# Patient Record
Sex: Male | Born: 1955 | Race: White | Hispanic: No | Marital: Married | Smoking: Never smoker
Health system: Southern US, Community
[De-identification: ages and names within clinical notes are randomized; demographics above are authoritative.]

## PROBLEM LIST (undated history)

## (undated) DIAGNOSIS — K222 Esophageal obstruction: Secondary | ICD-10-CM

## (undated) DIAGNOSIS — E785 Hyperlipidemia, unspecified: Secondary | ICD-10-CM

## (undated) DIAGNOSIS — H409 Unspecified glaucoma: Secondary | ICD-10-CM

## (undated) DIAGNOSIS — K219 Gastro-esophageal reflux disease without esophagitis: Secondary | ICD-10-CM

## (undated) HISTORY — DX: Gastro-esophageal reflux disease without esophagitis: K21.9

## (undated) HISTORY — DX: Unspecified glaucoma: H40.9

## (undated) HISTORY — DX: Hyperlipidemia, unspecified: E78.5

## (undated) HISTORY — PX: CHOLECYSTECTOMY: SHX55

## (undated) HISTORY — DX: Esophageal obstruction: K22.2

---

## 2002-08-07 ENCOUNTER — Ambulatory Visit (HOSPITAL_COMMUNITY): Admission: RE | Admit: 2002-08-07 | Discharge: 2002-08-07 | Payer: Self-pay | Admitting: Internal Medicine

## 2005-01-29 ENCOUNTER — Ambulatory Visit: Payer: Self-pay | Admitting: Internal Medicine

## 2005-01-29 ENCOUNTER — Ambulatory Visit (HOSPITAL_COMMUNITY): Admission: RE | Admit: 2005-01-29 | Discharge: 2005-01-29 | Payer: Self-pay | Admitting: Internal Medicine

## 2006-12-13 ENCOUNTER — Ambulatory Visit: Payer: Self-pay | Admitting: Internal Medicine

## 2006-12-23 ENCOUNTER — Ambulatory Visit (HOSPITAL_COMMUNITY): Admission: RE | Admit: 2006-12-23 | Discharge: 2006-12-23 | Payer: Self-pay | Admitting: Internal Medicine

## 2006-12-23 ENCOUNTER — Encounter: Payer: Self-pay | Admitting: Internal Medicine

## 2006-12-23 ENCOUNTER — Ambulatory Visit: Payer: Self-pay | Admitting: Internal Medicine

## 2006-12-23 HISTORY — PX: COLONOSCOPY: SHX174

## 2006-12-23 HISTORY — PX: ESOPHAGOGASTRODUODENOSCOPY: SHX1529

## 2009-08-19 ENCOUNTER — Emergency Department (HOSPITAL_COMMUNITY): Admission: EM | Admit: 2009-08-19 | Discharge: 2009-08-19 | Payer: Self-pay | Admitting: Emergency Medicine

## 2010-03-07 ENCOUNTER — Encounter (INDEPENDENT_AMBULATORY_CARE_PROVIDER_SITE_OTHER): Payer: Self-pay

## 2010-07-29 NOTE — Letter (Signed)
Summary: Recall, Screening Colonoscopy Only  [facility]  [address]   [place], [place] [address]   Phone: [phone]  Fax: [phone]    March 07, 2010  HAGOP MCCOLLAM [address] [place], [place]  [address] 06-13-1956   Dear Mr. Hellmer,   Our records indicate it is time to schedule your colonoscopy.    Please call our office at [phone] and ask for the nurse.   Thank you,  Hendricks Limes, LPN Cloria Spring, LPN  [facility] Ph: [phone]   Fax: [phone]

## 2010-11-11 NOTE — H&P (Signed)
NAMEANTOINETTE, Damon Wilson              ACCOUNT NO.:  [id number]   MEDICAL RECORD NO.:  [id number]          PATIENT TYPE:  AMB   LOCATION:                                FACILITY:  [facility]   PHYSICIAN:  R. Roetta Sessions, M.D. DATE OF BIRTH:  [birth year]   DATE OF ADMISSION:  12/13/2006  DATE OF DISCHARGE:  LH                              HISTORY & PHYSICAL   CHIEF COMPLAINT:  Recurrent dysphagia/screening colonoscopy.   HISTORY OF PRESENT ILLNESS:  Mr. Carcione is a 55 year old Caucasian male  with history of esophageal dysphagia due to Schatzki's ring.  He was  last dilated on January 29, 2005, by Dr. Jena Gauss.  He was dilated with a 60-  [place] Maloney dilator which only partially ruptured the ring,  subsequently three quadrant bites of the ring with cold biopsy forceps  were taken to additionally disrupt.  He also has a small hiatal hernia.  He tells me about 7 months ago he developed the sensation that his food  was backing up.  He points to his mid and upper esophagus.  He  complains of chest pain, occasional heartburn, indigestion.  He is on  AcipHex 20 mg daily which does seem to help with the majority of his  symptoms.  He has problems with solid foods only, denies any problems  with liquids, denies any nausea, vomiting or regurgitation.  He is in  need of screening colonoscopy.  Denies any diarrhea or constipation.  Denies rectal bleeding or melena.   PAST MEDICAL AND SURGICAL HISTORY:  History of multiple esophageal  dilatations due to recurrent dysphagia due to Schatzki's ring.  He has a  history of small hiatal hernia, last EGD as described in 2006 in HPI.  Hypercholesterolemia.  Glaucoma.  Status post laparoscopic  cholecystectomy.   CURRENT MEDICATIONS:  AcipHex 20 mg daily, simvastatin 10 mg daily  living, Lumigan drops once daily.   ALLERGIES:  NO KNOWN DRUG ALLERGIES.   FAMILY HISTORY:  Positive for father deceased with pancreatic cancer at  age 44.   SOCIAL HISTORY:  Mr.  Daniel is married.  He has three grown healthy  children.  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  He denies tobacco, alcohol, or  drug use.   REVIEW OF SYSTEMS:  CONSTITUTIONAL:  Weight is stable.  Denies any  anorexia, fever or chills.  Otherwise negative review of systems.   PHYSICAL EXAMINATION:  VITAL SIGNS:  Weight 260.5 pounds, height 73  inches, temperature 98.3, blood pressure 118/80, pulse 60.  GENERAL:  Mr. Oriordan is an obese Caucasian male who is alert, pleasant,  cooperative, no acute distress.  HEENT:  Sclerae clear, nonicteric.  Conjunctivae pink.  Oropharynx pink  and moist without any lesions.  NECK:  Supple without mass or thyromegaly.  CHEST:  Heart regular rate and rhythm.  Normal S1-S2.  No murmurs,  clicks, rubs, or gallops.  LUNGS:  Clear to auscultation bilaterally.  ABDOMEN:  Protuberant with positive bowel sounds x4.  No bruits  auscultated.  Soft, nontender and nondistended without palpable mass or  hepatosplenomegaly, no apparent tenderness or guarding.  EXTREMITIES:  Without clubbing or edema bilaterally.  SKIN:  Pink, warm, and dry without any rash or jaundice.   IMPRESSION:  Mr. Thelin is a 55 year old Caucasian male with recurrent  dysphagia I suspect due to recurrence of Schatzki's ring.  He has  chronic gastroesophageal reflux disease with symptoms well-controlled on  PPI.  He is due for screening colonoscopy.   PLAN:  1. EGD with possible ED and screening colonoscopy with Dr. Jena Gauss in      the near future.  Discussed the procedure including risks and      benefits which include but are not limited bleeding, infection,      perforation, drug reaction.  He agrees with plan and consent will      be obtained.  2. Continue AcipHex 20 mg daily.      Lorenza Burton, N.P.      [name], M.D.  Electronically Signed    KJ/MEDQ  D:  12/13/2006  T:  12/13/2006  Job:  [id number]   cc:   Selinda Flavin  Fax: [phone]

## 2010-11-11 NOTE — Op Note (Signed)
NAME:  Damon Wilson, Damon Wilson              ACCOUNT NO.:  [id number]   MEDICAL RECORD NO.:  [id number]          PATIENT TYPE:  AMB   LOCATION:  DAY                           FACILITY:  [facility]   PHYSICIAN:  R. Roetta Sessions, M.D. DATE OF BIRTH:  Mar 22, 1956   DATE OF PROCEDURE:  12/23/2006  DATE OF DISCHARGE:                               OPERATIVE REPORT   PROCEDURES:  EGD (esophagogastroduodenoscopy) with Damon Wilson dilation  followed by biopsy followed by colonoscopy and snare polypectomy.   INDICATIONS FOR PROCEDURE:  The patient is a pleasant 55 year old  gentleman with history of esophageal dysphagia secondary to Schatzki's  ring who presents with recurrent esophageal dysphagia.  His ring was  last dilated on January 29, 2005.  He was dilated up to 60-French [facility]  dilator and had biopsy disruption the ring additionally.   He is also here for screening colonoscopy.  There is no family history  of colorectal neoplasia.  He has no lower GI tract symptoms.  He has  never had his lower GI tract evaluated.  EGD with possible esophageal  dilation and colonoscopy are now being done.  This approach was  discussed  with the patient at length.  Potential risks, benefits, and  alternatives have been reviewed.  Please see documentation in the  medical record.   MONITORING:  O2 saturation, blood pressure, pulse, and respirations were  monitored throughout the entirety of the above procedures.   CONSCIOUS SEDATION:  Conscious sedation for the above procedures was  Versed 6 mg IV and Demerol 75 mg IV in divided doses.   INSTRUMENT:  Pentax video chip system.   Cetacaine spray for topical pharyngeal anesthesia.   FINDINGS:  ESOPHAGOGASTRODUODENOSCOPY:  Examination of tubular esophagus  revealed a prominent Schatzki ring.  There was a minimally ribbed  appearance to the mid esophageal mucosa.  However, this was subtle, and  the esophageal lumen midway was pretty much large bore.  The esophageal  mucosa  otherwise appeared normal.  EG junction was easily traversed with  the scope.   STOMACH:  Gastric cavity was empty and inflated well with air.  Thorough  examination of the gastric mucosa including retroflexion showed the  proximal stomach and esophagogastric junction demonstrated only a small  hiatal hernia.  There was a 5-mm nodule straddling the EG junction just  on the gastric side of the ring.  Otherwise, the gastric mucosa appeared  normal.  Pylorus was patent and easily traversed.  Examination of the  bulb and second portion revealed no abnormalities aside from prominent  Brunner glands in the bulb.   THERAPEUTIC/DIAGNOSTIC MANEUVERS PERFORMED:  A 58-French Maloney dilator  was passed to full insertion.  A look back revealed a nice disruption of  the ring.  There was no mucosal tear upstream of the ring.  Subsequently, then nodule at the GE junction was biopsied for histologic  study.  I elected to go ahead and biopsy the mid esophagus just to rule  out the remote possibility of eosinophilic esophagitis.  The patient  tolerated this procedure well and was prepared for colonoscopy.   Digital rectal  exam revealed no abnormalities.   ENDOSCOPIC FINDINGS:  The prep was good.  Colonic mucosa was surveyed  from the rectosigmoid junction through the left transverse, right colon,  and to the area of the appendiceal orifice, ileocecal valve and cecum.  These structures were well seen and photographed for the record.  Terminal ileum was intubated to 10 cm.  From this level, the scope was  slowly and cautiously withdrawn.  All previously mentioned mucosal  surfaces were again seen.  The patient had extensive left-sided  diverticula.  The patient had a 1-cm sessile polyp on a fold of the mid  descending colon.  Please see photos.  This was engaged with the snare  and totally resected with 2 passes of polyp fragments recovered through  the scope.  The remainder of the colonic mucosa  appeared normal.  The  terminal ileum mucosa appeared normal.  Scope was pulled down into the  rectum.  A thorough examination of the rectal mucosa including  retroflexed view of the anal verge revealed no abnormalities.  The  patient tolerated the procedure well and was reactive after endoscopy.   IMPRESSION:  1. EGD (esophagogastroduodenoscopy) revealed prominent Schatzki ring,      status post dilation and disruption as described above.  Nodule at      the GE (gastroesophageal) junction was biopsied.  Mid esophagus was      biopsied as well.  Small hiatal hernia.  Otherwise, normal stomach,      D1 and D2.  2. Colonoscopy findings:  Normal rectum.  Left-sided diverticula.      Polyp in the ascending colon removed with hot snare cautery.      Remainder of the colonic mucosa and terminal ileum mucosa appeared      normal.   RECOMMENDATIONS:  1. Continue Aciphex 20 mg orally daily.  2. Literature on hiatal hernia, gastroesophageal reflux disease and      polyps as well as diverticulosis provided to [name].  3. No aspirin or arthritis medications for 10 days.  4. Followup on path.   DISCUSSION:  I doubt [name] has eosinophilic esophagitis.  The  esophageal lumen throughout his esophagus down to the level of the  Schatzki ring was widely patent and his esophagus tolerated passage of a  58-French Maloney dilator without any disruption of the esophageal  mucosa aside from the distal Schatzki ring.  (I feel the ring is the  prominent problem in [name] case.)  Further recommendations to  follow.      [name], M.D.  Electronically Signed     RMR/MEDQ  D:  12/23/2006  T:  12/23/2006  Job:  [id number]   cc:   Selinda Flavin  Fax: [phone]

## 2010-11-14 NOTE — Op Note (Signed)
[name], [name]              ACCOUNT NO.:  [id number]   MEDICAL RECORD NO.:  [id number]          PATIENT TYPE:  AMB   LOCATION:  DAY                           FACILITY:  [facility]   PHYSICIAN:  R. Roetta Sessions, M.D. DATE OF BIRTH:  Dec 03, 1955   DATE OF PROCEDURE:  01/29/2005  DATE OF DISCHARGE:                                 OPERATIVE REPORT   PROCEDURE:  Esophagogastroduodenoscopy with Elease Hashimoto dilation.   INDICATIONS FOR PROCEDURE:  The patient is a 55 year old gentleman with a  known Schatzki's ring who has had recurrent esophageal dysphagia since  January this year back in February of 2004. He underwent an EGD for similar  complaints. He was found to have a Schatzki's ring and small hiatal hernia  and was dilated with a 58-French Maloney dilator. He enjoyed improvement in  dysphagia symptoms until recently. His reflux symptoms were also well  controlled on Aciphex until he was switched to Protonix. This did not work  nearly as well, and now he is back on Aciphex with excellent control of his  symptoms. EGD is now being done. Potential for esophageal dilation has been  fully explained. Potential risks, benefits, and alternatives have been  reviewed and questions answered. He is agreeable. Please see documentation  in the medical record.   PROCEDURE NOTE:  O2 saturation, blood pressure, pulse, and respirations were  monitored throughout the entire procedure. Conscious sedation with Versed 3  mg IV and Demerol 75 mg IV in divided doses. Cetacaine spray for topical  oropharyngeal anesthesia.   FINDINGS:  Examination of the tubular esophagus again revealed a Schatzki's  ring. The esophageal mucosa otherwise appeared normal. EG junction easily  traversed.   Stomach:  Gastric cavity was empty and insufflated well with air. Thorough  examination of gastric mucosa including retroflexed view of the proximal  stomach and esophagogastric junction demonstrated a small hiatal hernia.  Otherwise gastric mucosa appeared normal. Pylorus patent and easily  traversed. Examination of bulb and second portion revealed no abnormalities.   THERAPEUTIC/DIAGNOSTIC MANEUVERS:  A 60-French Maloney dilator was passed to  full insertion easily. A look back revealed the ring had only been partially  ruptured. Subsequently, three quadrant bites of the ring with cold biopsy  forceps were taken to additionally disrupt. This was done without difficulty  and without apparent complication. The patient tolerated the procedure well  and was reactive to endoscopy.   IMPRESSION:  1.  Schatzki's ring, otherwise normal esophagus status post dilation and      disruption as described above. Otherwise normal esophagus.  2.  Small hiatal hernia. Otherwise normal stomach. Normal D1 and D2.   RECOMMENDATIONS:  1.  Continue acid suppression with Aciphex 20 mg orally daily.  2.  Mr. Bunton is to call me if he has any future difficulties with      dysphagia.       RMR/MEDQ  D:  01/29/2005  T:  01/29/2005  Job:  [id number]   cc:   Marcelino Duster  [address] [place]  [place] [address]  Fax: [phone]

## 2010-11-14 NOTE — Op Note (Signed)
NAME:  DEMAREON, Damon Wilson                        ACCOUNT NO.:  [id number]   MEDICAL RECORD NO.:  [id number]                   PATIENT TYPE:  AMB   LOCATION:  DAY                                  FACILITY:  [facility]   PHYSICIAN:  R. Roetta Sessions, M.D.              DATE OF BIRTH:  February 20, 1956   DATE OF PROCEDURE:  08/07/2002  DATE OF DISCHARGE:                                 OPERATIVE REPORT   PROCEDURE:  Esophagogastroduodenoscopy with Elease Hashimoto dilation.   INDICATION FOR PROCEDURE:  The patient is a 55 year old gentleman with  esophageal dysphagia.  He underwent Maloney dilation with a 46 French  Maloney dilator back in January 2001 at [facility] for dysphagia and  was found to have a Schatzki's ring.  He has done well for the past one  year.  He has had intermittent esophageal dysphagia and had what sounds like  a transient meat impaction at a steak house last weekend, which necessitated  an emergency department visit.  He vomited in the emergency department and  disimpacted himself.  EGD is now being done to further evaluate his symptoms  and perform dilation.  This approach has been discussed with the patient,  and the potential risks, benefits, and alternatives have been reviewed,  questions answered, and he is agreeable.  Please note, his reflux symptoms  have been well-controlled with Aciphex, Prevacid, and most recently Protonix  40 mg orally daily.  ASA-2.   DESCRIPTION OF PROCEDURE:  O2 saturation, blood pressure, pulse, and  respirations were monitored throughout the entire procedure.   Conscious sedation:  Versed 3 mg IV, Demerol 75 mg IV, Cetacaine spray for  topical oropharyngeal anesthesia.   Instrument:  Olympus video chip gastroscope.   Findings:  Examination of the tubular esophagus revealed a noncritical-  appearing Schatzki's ring.  The esophageal mucosa otherwise appeared normal.  The EG junction was easily traversed.   Stomach:  The gastric cavity was  entered and insufflated well with air.  A  thorough examination of the gastric mucosa, including a retroflexed view of  the proximal stomach and esophagogastric junction, demonstrated only a small  hiatal hernia.  The pylorus was patent and easily traversed.  D1, D2 were  well-seen and appeared normal.   Therapeutic/diagnostic maneuvers performed:  A 56 French Maloney dilator was  passed fully with ease.  Subsequently a 30 French Maloney dilator was passed  fully with ease.  A look back revealed a slight amount of blood at the EG  junction.  The ring appeared to have been ruptured without apparent  complication.  The patient tolerated the procedure well, was reactive in  endoscopy.   IMPRESSION:  1. Noncritical-appearing Schatzki's ring, status post dilation up to a 58     French size, otherwise normal-appearing esophagus.  2. Small hiatal hernia, otherwise normal stomach, D1, D2.    RECOMMENDATIONS:  1. Continue Protonix 40 mg orally daily.  2. Follow up with Dr. Orvan Falconer as needed.                                               [name], M.D.    RMR/MEDQ  D:  08/07/2002  T:  08/07/2002  Job:  [id number]   cc:   Marcelino Duster  [address] [place]  [place] [address]  Fax: [phone]

## 2012-05-19 ENCOUNTER — Other Ambulatory Visit: Payer: Self-pay | Admitting: Family Medicine

## 2012-05-19 DIAGNOSIS — R05 Cough: Secondary | ICD-10-CM

## 2012-05-19 DIAGNOSIS — J90 Pleural effusion, not elsewhere classified: Secondary | ICD-10-CM

## 2012-05-20 ENCOUNTER — Ambulatory Visit
Admission: RE | Admit: 2012-05-20 | Discharge: 2012-05-20 | Disposition: A | Discharge status: Home / Self Care | Payer: Managed Care, Other (non HMO) | Source: Ambulatory Visit | Attending: Family Medicine | Admitting: Family Medicine

## 2012-05-20 DIAGNOSIS — J90 Pleural effusion, not elsewhere classified: Secondary | ICD-10-CM

## 2012-05-20 DIAGNOSIS — R05 Cough: Secondary | ICD-10-CM

## 2012-05-20 MED ORDER — IOHEXOL 300 MG/ML  SOLN
75.0000 mL | Freq: Once | INTRAMUSCULAR | Status: AC | PRN
  Administered 2012-05-20: 75 mL via INTRAVENOUS

## 2012-12-14 LAB — WBC
HCT: 38 %
MCV: 72 fL — AB (ref 80–97)
WBC: 7

## 2012-12-27 ENCOUNTER — Telehealth: Payer: Self-pay

## 2012-12-27 NOTE — Telephone Encounter (Signed)
LMOM for pt to call and schedule OV appt.

## 2012-12-27 NOTE — Telephone Encounter (Signed)
Pt was referred by Dr. Dimas Aguas for colonoscopy. Notes say he has anemia ( Hemogobin 11.3 ) and that he might need EGD.

## 2012-12-29 NOTE — Telephone Encounter (Signed)
Pt is scheduled for OV on 01/18/2013 at 8:30 AM with Tana Coast, PA.

## 2013-01-16 ENCOUNTER — Encounter: Payer: Self-pay | Admitting: Internal Medicine

## 2013-01-18 ENCOUNTER — Encounter: Payer: Self-pay | Admitting: Gastroenterology

## 2013-01-18 ENCOUNTER — Ambulatory Visit (INDEPENDENT_AMBULATORY_CARE_PROVIDER_SITE_OTHER): Payer: Managed Care, Other (non HMO) | Admitting: Gastroenterology

## 2013-01-18 VITALS — BP 110/80 | HR 51 | Temp 98.0°F | Ht 73.0 in | Wt 245.6 lb

## 2013-01-18 DIAGNOSIS — R1314 Dysphagia, pharyngoesophageal phase: Secondary | ICD-10-CM

## 2013-01-18 DIAGNOSIS — R1310 Dysphagia, unspecified: Secondary | ICD-10-CM | POA: Insufficient documentation

## 2013-01-18 DIAGNOSIS — K219 Gastro-esophageal reflux disease without esophagitis: Secondary | ICD-10-CM | POA: Insufficient documentation

## 2013-01-18 DIAGNOSIS — D509 Iron deficiency anemia, unspecified: Secondary | ICD-10-CM | POA: Insufficient documentation

## 2013-01-18 LAB — CBC WITH DIFFERENTIAL/PLATELET
Basophils Relative: 1 % (ref 0–1)
Eosinophils Absolute: 0.1 10*3/uL (ref 0.0–0.7)
Eosinophils Relative: 2 % (ref 0–5)
MCH: 21.4 pg — ABNORMAL LOW (ref 26.0–34.0)
MCV: 68 fL — ABNORMAL LOW (ref 78.0–100.0)
Monocytes Relative: 14 % — ABNORMAL HIGH (ref 3–12)
Platelets: 273 10*3/uL (ref 150–400)
RDW: 17.5 % — ABNORMAL HIGH (ref 11.5–15.5)

## 2013-01-18 MED ORDER — PEG 3350-KCL-NA BICARB-NACL 420 G PO SOLR: 4000.0000 mL | ORAL | Status: DC

## 2013-01-18 NOTE — Progress Notes (Signed)
Cc PCP 

## 2013-01-18 NOTE — Assessment & Plan Note (Signed)
Recent trial off of the omeprazole for one week. Recurrent heartburn indicating need for ongoing treatment. Resume omeprazole 20 mg daily.

## 2013-01-18 NOTE — Assessment & Plan Note (Addendum)
Recurrent esophageal dysphagia status post multiple esophageal dilations in the past for Schatzki ring. Previous esophageal biopsies negative for eosinophilic esophagitis. Last upper endoscopy in 2008. Plan for EGD with dilation in the near future with Dr. Jena Gauss.  I have discussed the risks, alternatives, benefits with regards to but not limited to the risk of reaction to medication, bleeding, infection, perforation and the patient is agreeable to proceed. Written consent to be obtained.  Continue omeprazole daily.

## 2013-01-18 NOTE — Patient Instructions (Signed)
1. We have scheduled you for an upper endoscopy and colonoscopy with Dr. Jena Gauss. Please see separate instructions. 2. Please have your blood work done at your earliest convenience.

## 2013-01-18 NOTE — Progress Notes (Signed)
Primary Care Physician:  HOWARD, KEVIN, MD  Primary Gastroenterologist:  Michael Rourk, MD   Chief Complaint  Patient presents with  . Colonoscopy  . EGD    HPI:  Damon Wilson is a 57 y.o. male here for further evaluation of microcytic anemia. Recent routine labs showed hemoglobin of 11.3, MCV 72. C/O solid food esophageal dysphagia. Burning in upper chest while eating recently lasted for 10-15 minutes. Feels dizzy.   No heartburn. Lost 44 pounds intentionally. Now off cholesterol medication. Stopped omeprazole for one week to see if heartburn present but had to resume the medication. No constipation, diarrhea. No melena, brbpr. No abdominal pain.  Current Outpatient Prescriptions  Medication Sig Dispense Refill  . omeprazole (PRILOSEC) 20 MG capsule Take 20 mg by mouth daily.       . Travoprost, BAK Free, (TRAVATAN) 0.004 % SOLN ophthalmic solution Place 1 drop into both eyes at bedtime.       No current facility-administered medications for this visit.    Allergies as of 01/18/2013  . (No Known Allergies)    Past Medical History  Diagnosis Date  . Glaucoma   . Hyperlipidemia   . GERD (gastroesophageal reflux disease)   . Schatzki's ring     multiple dilations    Past Surgical History  Procedure Laterality Date  . Esophagogastroduodenoscopy  12/23/2006    RMR:prominent Schatzki ring s/p dilation and disruption as described above.Nodule at the GE juction/Mid esophagus/Small hiatal hernia. Esophageal nodule benign. Esophageal biopsies unremarkable.  . Colonoscopy  12/23/2006    RMR: Normal rectum.  Left-sided diverticula. Polyp in the descending colon(tubular adenoma). Normal terminal ileum.  . Cholecystectomy      Family History  Problem Relation Age of Onset  . Pancreatic cancer Father     Age 68  . Colon cancer Neg Hx     History   Social History  . Marital Status: Married    Spouse Name: N/A    Number of Children: 3  . Years of Education: N/A    Occupational History  . Pastor    Social History Main Topics  . Smoking status: Never Smoker   . Smokeless tobacco: Not on file  . Alcohol Use: No  . Drug Use: No  . Sexually Active: Not on file   Other Topics Concern  . Not on file   Social History Narrative  . No narrative on file      ROS:  General: Negative for anorexia, weight loss, fever, chills, fatigue, weakness. Mild dizziness. Eyes: Negative for vision changes.  ENT: Negative for hoarseness, difficulty swallowing , nasal congestion. CV: Negative for chest pain, angina, palpitations, dyspnea on exertion, peripheral edema.  Respiratory: Negative for dyspnea at rest, dyspnea on exertion, cough, sputum, wheezing.  GI: See history of present illness. GU:  Negative for dysuria, hematuria, urinary incontinence, urinary frequency, nocturnal urination.  MS: Negative for joint pain, low back pain.  Derm: Negative for rash or itching.  Neuro: Negative for weakness, abnormal sensation, seizure, frequent headaches, memory loss, confusion.  Psych: Negative for anxiety, depression, suicidal ideation, hallucinations.  Endo: Negative for unusual weight change.  Heme: Negative for bruising or bleeding. Allergy: Negative for rash or hives.    Physical Examination:  BP 97/65  Pulse 51  Temp(Src) 98 F (36.7 C) (Oral)  Ht 6' 1" (1.854 m)  Wt 245 lb 9.6 oz (111.403 kg)  BMI 32.41 kg/m2   General: Well-nourished, well-developed in no acute distress.  Head: Normocephalic, atraumatic.     Eyes: Conjunctiva pink, no icterus. Mouth: Oropharyngeal mucosa moist and pink , no lesions erythema or exudate. Neck: Supple without thyromegaly, masses, or lymphadenopathy.  Lungs: Clear to auscultation bilaterally.  Heart: Regular rate and rhythm, no murmurs rubs or gallops.  Abdomen: Bowel sounds are normal, nontender, nondistended, no hepatosplenomegaly or masses, no abdominal bruits or    hernia , no rebound or guarding.   Rectal:  Deferred Extremities: No lower extremity edema. No clubbing or deformities.  Neuro: Alert and oriented x 4 , grossly normal neurologically.  Skin: Warm and dry, no rash or jaundice.   Psych: Alert and cooperative, normal mood and affect.  Labs: Labs from 12/13/2012. Hemoglobin 11.3, hematocrit 30.3, MCV 72, white blood cell count 7000, platelets 274,000.  Imaging Studies: No results found.    

## 2013-01-18 NOTE — Assessment & Plan Note (Signed)
Mild microcytic anemia. Hemoccult status unknown. Last colonoscopy in 2008. Tubular adenoma removed at that time. Colonoscopy in the near future.  I have discussed the risks, alternatives, benefits with regards to but not limited to the risk of reaction to medication, bleeding, infection, perforation and the patient is agreeable to proceed. Written consent to be obtained.

## 2013-01-20 NOTE — Progress Notes (Signed)
Quick Note:  Please let patient know his anemia is stable. Procedures as planned. ______

## 2013-01-25 ENCOUNTER — Encounter (HOSPITAL_COMMUNITY): Payer: Self-pay | Admitting: Pharmacy Technician

## 2013-02-02 ENCOUNTER — Encounter (HOSPITAL_COMMUNITY)
Admission: RE | Disposition: A | Discharge status: Home / Self Care | Payer: Self-pay | Source: Ambulatory Visit | Attending: Internal Medicine

## 2013-02-02 ENCOUNTER — Encounter (HOSPITAL_COMMUNITY): Payer: Self-pay | Admitting: *Deleted

## 2013-02-02 ENCOUNTER — Ambulatory Visit (HOSPITAL_COMMUNITY)
Admission: RE | Admit: 2013-02-02 | Discharge: 2013-02-02 | Disposition: A | Discharge status: Home / Self Care | Payer: Managed Care, Other (non HMO) | Source: Ambulatory Visit | Attending: Internal Medicine | Admitting: Internal Medicine

## 2013-02-02 DIAGNOSIS — Z860100 Personal history of colon polyps, unspecified: Secondary | ICD-10-CM | POA: Insufficient documentation

## 2013-02-02 DIAGNOSIS — R1310 Dysphagia, unspecified: Secondary | ICD-10-CM

## 2013-02-02 DIAGNOSIS — D509 Iron deficiency anemia, unspecified: Secondary | ICD-10-CM

## 2013-02-02 DIAGNOSIS — K219 Gastro-esophageal reflux disease without esophagitis: Secondary | ICD-10-CM

## 2013-02-02 DIAGNOSIS — K222 Esophageal obstruction: Secondary | ICD-10-CM

## 2013-02-02 DIAGNOSIS — K5730 Diverticulosis of large intestine without perforation or abscess without bleeding: Secondary | ICD-10-CM | POA: Insufficient documentation

## 2013-02-02 DIAGNOSIS — K449 Diaphragmatic hernia without obstruction or gangrene: Secondary | ICD-10-CM | POA: Insufficient documentation

## 2013-02-02 DIAGNOSIS — K633 Ulcer of intestine: Secondary | ICD-10-CM | POA: Insufficient documentation

## 2013-02-02 HISTORY — PX: ESOPHAGEAL DILATION: SHX303

## 2013-02-02 HISTORY — PX: COLONOSCOPY WITH ESOPHAGOGASTRODUODENOSCOPY (EGD): SHX5779

## 2013-02-02 SURGERY — COLONOSCOPY WITH ESOPHAGOGASTRODUODENOSCOPY (EGD)
Anesthesia: Moderate Sedation

## 2013-02-02 MED ORDER — SODIUM CHLORIDE 0.9 % IV SOLN
INTRAVENOUS | Status: DC
  Administered 2013-02-02: 09:00:00 via INTRAVENOUS

## 2013-02-02 MED ORDER — MEPERIDINE HCL 100 MG/ML IJ SOLN
INTRAMUSCULAR | Status: DC | PRN
  Administered 2013-02-02: 50 mg via INTRAVENOUS
  Administered 2013-02-02: 25 mg via INTRAVENOUS

## 2013-02-02 MED ORDER — ONDANSETRON HCL 4 MG/2ML IJ SOLN
INTRAMUSCULAR | Status: DC | PRN
  Administered 2013-02-02: 4 mg via INTRAVENOUS

## 2013-02-02 MED ORDER — SIMETHICONE 40 MG/0.6ML PO SUSP
ORAL | Status: DC | PRN
  Administered 2013-02-02: 10:00:00

## 2013-02-02 MED ORDER — MIDAZOLAM HCL 5 MG/5ML IJ SOLN
INTRAMUSCULAR | Status: DC | PRN
  Administered 2013-02-02: 2 mg via INTRAVENOUS
  Administered 2013-02-02: 1 mg via INTRAVENOUS

## 2013-02-02 MED ORDER — MIDAZOLAM HCL 5 MG/5ML IJ SOLN
INTRAMUSCULAR | Status: AC
  Filled 2013-02-02: qty 10

## 2013-02-02 MED ORDER — BUTAMBEN-TETRACAINE-BENZOCAINE 2-2-14 % EX AERO
INHALATION_SPRAY | CUTANEOUS | Status: DC | PRN
  Administered 2013-02-02: 2 via TOPICAL

## 2013-02-02 MED ORDER — MEPERIDINE HCL 100 MG/ML IJ SOLN
INTRAMUSCULAR | Status: AC
  Filled 2013-02-02: qty 1

## 2013-02-02 MED ORDER — ONDANSETRON HCL 4 MG/2ML IJ SOLN
INTRAMUSCULAR | Status: AC
  Filled 2013-02-02: qty 2

## 2013-02-02 NOTE — Op Note (Signed)
[facility] [address] [place], [address]   COLONOSCOPY PROCEDURE REPORT  PATIENT: Damon Wilson, Damon Wilson  MR#:         [id number] BIRTHDATE: [birth year] , 56  yrs. old GENDER: Male ENDOSCOPIST: R.  Roetta Sessions, MD FACP FACG REFERRED BY:  Selinda Flavin, M.D. PROCEDURE DATE:  02/02/2013 PROCEDURE:     Ileocolonoscopy with biopsy  INDICATIONS: iron deficiency anemia; history of colonic adenoma  INFORMED CONSENT:  The risks, benefits, alternatives and imponderables including but not limited to bleeding, perforation as well as the possibility of a missed lesion have been reviewed.  The potential for biopsy, lesion removal, etc. have also been discussed.  Questions have been answered.  All parties agreeable. Please see the history and physical in the medical record for more information.  MEDICATIONS: Versed 4 mg IV and Demerol 75 mg IV in divided doses. Zofran 4 g IV  DESCRIPTION OF PROCEDURE:  After a digital rectal exam was performed, the EC-[id number] ([id number])  colonoscope was advanced from the anus through the rectum and colon to the area of the cecum, ileocecal valve and appendiceal orifice.  The cecum was deeply intubated.  These structures were well-seen and photographed for the record.  From the level of the cecum and ileocecal valve, the scope was slowly and cautiously withdrawn.  The mucosal surfaces were carefully surveyed utilizing scope tip deflection to facilitate fold flattening as needed.  The scope was pulled down into the rectum where a thorough examination including retroflexion was performed.    FINDINGS:  Adequate preparation. Normal rectum. Pancolonic diverticulosis. Colonic mucosa otherwise appeared normal. The patient had multiple  ulcers in the distal 10 cm of terminal ileal mucosa.  THERAPEUTIC / DIAGNOSTIC MANEUVERS PERFORMED:  The abnormal terminal ileal mucosa was biopsied  COMPLICATIONS: None  CECAL WITHDRAWAL TIME:  13  minutes  IMPRESSION:  Colonic diverticulosis. Ileal ulcers-status post biopsy  RECOMMENDATIONS: Followup on pathology. Avoid all nonsteroidals for now. See EGD report.   _______________________________ eSigned:  R. Roetta Sessions, MD FACP [facility] 02/02/2013 10:38 AM   CC:    PATIENT NAME:  Damon Wilson, Damon Wilson MR#: [id number]

## 2013-02-02 NOTE — Op Note (Signed)
[facility] [address] [place], [address]   ENDOSCOPY PROCEDURE REPORT  PATIENT: Damon Wilson, Damon Wilson  MR#: [id number] BIRTHDATE: Dec 30, 1955 , 56  yrs. old GENDER: Male ENDOSCOPIST: R.  Roetta Sessions, MD FACP FACG REFERRED BY:  Selinda Flavin, M.D. PROCEDURE DATE:  02/02/2013 PROCEDURE:     EGD with Elease Hashimoto dilation  INDICATIONS:     Recurrent esophageal dysphagia; history of Schatzki's ring  INFORMED CONSENT:   The risks, benefits, limitations, alternatives and imponderables have been discussed.  The potential for biopsy, esophogeal dilation, etc. have also been reviewed.  Questions have been answered.  All parties agreeable.  Please see the history and physical in the medical record for more information.  MEDICATIONS:    Versed 3 mg IV and Demerol 75 mg IV in divided doses. Zofran 4 mg IV. Cetacaine spray to  DESCRIPTION OF PROCEDURE:   The EG-[id number] ([id number])  endoscope was introduced through the mouth and advanced to the second portion of the duodenum without difficulty or limitations.  The mucosal surfaces were surveyed very carefully during advancement of the scope and upon withdrawal.  Retroflexion view of the proximal stomach and esophagogastric junction was performed.      FINDINGS: Noncritical. Schatzki's ring; otherwise, normal-appearing esophagus.  Stomach empty. Small hiatal hernia. Normal gastric mucosa. Patent pylorus. Normal first and second portion of the duodenum.  THERAPEUTIC / DIAGNOSTIC MANEUVERS PERFORMED:  A 56 French Maloney dilator was passed to full insertion easily. Subsequently, a 19 French Maloney dilator is passed to full insertion easily. A look back revealed the ring was only partially ruptured with these maneuvers. Finally, I took 2 quadrant "bites" of the ring  with the biopsy forceps to disrupt it additionally. This was done without difficulty or apparent complication.   COMPLICATIONS:  None  IMPRESSION:    Schatzki's ring. Status post dilation/disruption as described above. Hiatal hernia.  RECOMMENDATIONS:   Continue Prilosec daily. See colonoscopy report.    _______________________________ R. Roetta Sessions, MD FACP [facility] eSigned:  R. Roetta Sessions, MD FACP [facility] 02/02/2013 10:17 AM     CC:

## 2013-02-02 NOTE — H&P (View-Only) (Signed)
Primary Care Physician:  Selinda Flavin, MD  Primary Gastroenterologist:  Roetta Sessions, MD   Chief Complaint  Patient presents with  . Colonoscopy  . EGD    HPI:  Damon Wilson is a 57 y.o. male here for further evaluation of microcytic anemia. Recent routine labs showed hemoglobin of 11.3, MCV 72. C/O solid food esophageal dysphagia. Burning in upper chest while eating recently lasted for 10-15 minutes. Feels dizzy.   No heartburn. Lost 44 pounds intentionally. Now off cholesterol medication. Stopped omeprazole for one week to see if heartburn present but had to resume the medication. No constipation, diarrhea. No melena, brbpr. No abdominal pain.  Current Outpatient Prescriptions  Medication Sig Dispense Refill  . omeprazole (PRILOSEC) 20 MG capsule Take 20 mg by mouth daily.       . Travoprost, BAK Free, (TRAVATAN) 0.004 % SOLN ophthalmic solution Place 1 drop into both eyes at bedtime.       No current facility-administered medications for this visit.    Allergies as of 01/18/2013  . (No Known Allergies)    Past Medical History  Diagnosis Date  . Glaucoma   . Hyperlipidemia   . GERD (gastroesophageal reflux disease)   . Schatzki's ring     multiple dilations    Past Surgical History  Procedure Laterality Date  . Esophagogastroduodenoscopy  12/23/2006    [id number] Schatzki ring s/p dilation and disruption as described above.Nodule at the GE juction/Mid esophagus/Small hiatal hernia. Esophageal nodule benign. Esophageal biopsies unremarkable.  . Colonoscopy  12/23/2006    RMR: Normal rectum.  Left-sided diverticula. Polyp in the descending colon(tubular adenoma). Normal terminal ileum.  . Cholecystectomy      Family History  Problem Relation Age of Onset  . Pancreatic cancer Father     Age 28  . Colon cancer Neg Hx     History   Social History  . Marital Status: Married    Spouse Name: N/A    Number of Children: 3  . Years of Education: N/A    Occupational History  . Pastor    Social History Main Topics  . Smoking status: Never Smoker   . Smokeless tobacco: Not on file  . Alcohol Use: No  . Drug Use: No  . Sexually Active: Not on file   Other Topics Concern  . Not on file   Social History Narrative  . No narrative on file      ROS:  General: Negative for anorexia, weight loss, fever, chills, fatigue, weakness. Mild dizziness. Eyes: Negative for vision changes.  ENT: Negative for hoarseness, difficulty swallowing , nasal congestion. CV: Negative for chest pain, angina, palpitations, dyspnea on exertion, peripheral edema.  Respiratory: Negative for dyspnea at rest, dyspnea on exertion, cough, sputum, wheezing.  GI: See history of present illness. GU:  Negative for dysuria, hematuria, urinary incontinence, urinary frequency, nocturnal urination.  MS: Negative for joint pain, low back pain.  Derm: Negative for rash or itching.  Neuro: Negative for weakness, abnormal sensation, seizure, frequent headaches, memory loss, confusion.  Psych: Negative for anxiety, depression, suicidal ideation, hallucinations.  Endo: Negative for unusual weight change.  Heme: Negative for bruising or bleeding. Allergy: Negative for rash or hives.    Physical Examination:  BP 97/65  Pulse 51  Temp(Src) 98 F (36.7 C) (Oral)  Ht 6\' 1"  (1.854 m)  Wt 245 lb 9.6 oz (111.403 kg)  BMI 32.41 kg/m2   General: Well-nourished, well-developed in no acute distress.  Head: Normocephalic, atraumatic.  Eyes: Conjunctiva pink, no icterus. Mouth: Oropharyngeal mucosa moist and pink , no lesions erythema or exudate. Neck: Supple without thyromegaly, masses, or lymphadenopathy.  Lungs: Clear to auscultation bilaterally.  Heart: Regular rate and rhythm, no murmurs rubs or gallops.  Abdomen: Bowel sounds are normal, nontender, nondistended, no hepatosplenomegaly or masses, no abdominal bruits or    hernia , no rebound or guarding.   Rectal:  Deferred Extremities: No lower extremity edema. No clubbing or deformities.  Neuro: Alert and oriented x 4 , grossly normal neurologically.  Skin: Warm and dry, no rash or jaundice.   Psych: Alert and cooperative, normal mood and affect.  Labs: Labs from 12/13/2012. Hemoglobin 11.3, hematocrit 30.3, MCV 72, white blood cell count 7000, platelets 274,000.  Imaging Studies: No results found.

## 2013-02-02 NOTE — Interval H&P Note (Signed)
History and Physical Interval Note:  02/02/2013 9:52 AM  Rogene Houston  has presented today for surgery, with the diagnosis of ESOPHAGEAL DYSPHAGIA, MICRO CYTIC ANEMIA AND GERD  The various methods of treatment have been discussed with the patient and family. After consideration of risks, benefits and other options for treatment, the patient has consented to  Procedure(s) with comments: COLONOSCOPY WITH ESOPHAGOGASTRODUODENOSCOPY (EGD) (N/A) - 9:30 ESOPHAGEAL DILATION as a surgical intervention .  The patient's history has been reviewed, patient examined, no change in status, stable for surgery.  I have reviewed the patient's chart and labs.  Questions were answered to the patient's satisfaction.     Jettie Lazare  No change. EGD with possible esophageal dilation and colonoscopy per plan.  The risks, benefits, limitations, imponderables and alternatives regarding both EGD and colonoscopy have been reviewed with the patient. Questions have been answered. All parties agreeable.

## 2013-02-03 ENCOUNTER — Encounter: Payer: Self-pay | Admitting: Internal Medicine

## 2013-02-06 ENCOUNTER — Encounter (HOSPITAL_COMMUNITY): Payer: Self-pay | Admitting: Internal Medicine

## 2013-02-06 ENCOUNTER — Encounter: Payer: Self-pay | Admitting: Internal Medicine

## 2013-02-06 NOTE — Progress Notes (Unsigned)
Letter from: Corbin Ade  Reason for Letter: Results Review Send letter to patient.  Send copy of letter with path to referring provider and PCP.  Needs a f/u appt in about 4 weeks  w extender to determine if further eval of small bowel needed or not

## 2013-02-06 NOTE — Progress Notes (Signed)
Pt is aware of OV on 9/9 at 130 with AS and appt card was mailed

## 2013-03-06 ENCOUNTER — Encounter: Payer: Self-pay | Admitting: Internal Medicine

## 2013-03-07 ENCOUNTER — Ambulatory Visit (INDEPENDENT_AMBULATORY_CARE_PROVIDER_SITE_OTHER): Payer: Managed Care, Other (non HMO) | Admitting: Gastroenterology

## 2013-03-07 ENCOUNTER — Encounter: Payer: Self-pay | Admitting: Gastroenterology

## 2013-03-07 VITALS — BP 121/71 | HR 54 | Temp 97.0°F

## 2013-03-07 DIAGNOSIS — D509 Iron deficiency anemia, unspecified: Secondary | ICD-10-CM

## 2013-03-07 NOTE — Progress Notes (Signed)
Referring Provider: Selinda Flavin, MD Primary Care Physician:  Selinda Flavin, MD Primary GI: Dr. Jena Gauss   Chief Complaint  Patient presents with  . follow up    HPI:   Returns today in follow-up after EGD/TCS due to anemia. Ileal ulcers noted at time of colonoscopy, with path revealing NSAID vs Crohn's etiology as possibility. Denies any routine use of NSAIDs. Very rare.  No abdominal pain, diarrhea. No lack of appetite. Purposeful weight loss. No FH of IBD. Rare Aleve, maybe once every other month. No aspirin powders. Dysphagia resolved. No GERD symptoms as long as he takes his PPI. He does not want to pursue capsule endoscopy at this time. He also does not want to pursue further blood work for several months. Does not enjoy going to the doctor.   Past Medical History  Diagnosis Date  . Glaucoma   . Hyperlipidemia   . GERD (gastroesophageal reflux disease)   . Schatzki's ring     multiple dilations    Past Surgical History  Procedure Laterality Date  . Esophagogastroduodenoscopy  12/23/2006    [id number] Schatzki ring s/p dilation and disruption as described above.Nodule at the GE juction/Mid esophagus/Small hiatal hernia. Esophageal nodule benign. Esophageal biopsies unremarkable.  . Colonoscopy  12/23/2006    RMR: Normal rectum.  Left-sided diverticula. Polyp in the descending colon(tubular adenoma). Normal terminal ileum.  . Cholecystectomy    . Colonoscopy with esophagogastroduodenoscopy (egd) N/A 02/02/2013    QIO:[id number] diverticulosis. Ileal ulcers-status post biopsy, consistent with NSAIDs vs Crohn's etiology   . Esophageal dilation  02/02/2013    UXL:[id number] ring. Status post dilation with 18 F dilator. Hiatal hernia,     Current Outpatient Prescriptions  Medication Sig Dispense Refill  . omeprazole (PRILOSEC) 20 MG capsule Take 20 mg by mouth daily.       . Travoprost, BAK Free, (TRAVATAN) 0.004 % SOLN ophthalmic solution Place 1 drop into both eyes at bedtime.        No current facility-administered medications for this visit.    Allergies as of 03/07/2013  . (No Known Allergies)    Family History  Problem Relation Age of Onset  . Pancreatic cancer Father     Age 15  . Colon cancer Neg Hx     History   Social History  . Marital Status: Married    Spouse Name: N/A    Number of Children: 3  . Years of Education: N/A   Occupational History  . Pastor    Social History Main Topics  . Smoking status: Never Smoker   . Smokeless tobacco: Not on file     Comment: Quit x 25 years  . Alcohol Use: No  . Drug Use: No  . Sexual Activity: Not on file   Other Topics Concern  . Not on file   Social History Narrative  . No narrative on file    Review of Systems: Negative unless mentioned in HPI  Physical Exam: BP 121/71  Pulse 54  Temp(Src) 97 F (36.1 C) (Oral) General:   Alert and oriented. No distress noted. Pleasant and cooperative.  Head:  Normocephalic and atraumatic. Eyes:  Conjuctiva clear without scleral icterus. Mouth:  Oral mucosa pink and moist. Good dentition. No lesions.. Heart:  S1, S2 present without murmurs, rubs, or gallops. Regular rate and rhythm. Abdomen:  +BS, soft, non-tender and non-distended. No rebound or guarding. No HSM or masses noted. Msk:  Symmetrical without gross deformities. Normal posture. Extremities:  Without edema. Neurologic:  Alert and  oriented x4;  grossly normal neurologically. Skin:  Intact without significant lesions or rashes. Psych:  Alert and cooperative. Normal mood and affect.  Lab Results  Component Value Date   WBC 6.7 01/18/2013   HGB 11.7* 01/18/2013   HCT 37.2* 01/18/2013   MCV 68.0* 01/18/2013   PLT 273 01/18/2013

## 2013-03-07 NOTE — Patient Instructions (Addendum)
We will check your iron levels and blood counts again in Nov/December. I will be faxing the orders to Dr. Dimas Aguas.   We will see you back again as needed. Your next colonoscopy will be in 5 years.

## 2013-03-08 ENCOUNTER — Encounter: Payer: Self-pay | Admitting: Gastroenterology

## 2013-03-08 NOTE — Progress Notes (Signed)
CC'd to PCP 

## 2013-03-08 NOTE — Assessment & Plan Note (Signed)
Stable, with TCS/EGD on file as above. Dysphagia improved after dilation of Schatzki's ring. Ileal ulcers noted, with possibility of NSAID vs Crohn's etiology. At this point, I recommended a capsule endoscopy to lay this issue to rest. He desires to avoid any further testing at this point, but he is agreeable to rechecking blood work. However, he wants to wait a few months until this is done with his primary care. He is completely asymptomatic at this point, and I question NSAID-induced injury; however, he only reports occasional NSAID use.  For now, avoid NSAIDs indefinitely Recheck CBC, iron and ferritin in Nov per patient's request Capsule study if persistent anemia or any other concerning signs

## 2013-05-04 ENCOUNTER — Other Ambulatory Visit: Payer: Self-pay

## 2013-05-18 IMAGING — CT CT CHEST W/ CM
3 series · 18 of 29 positions shown, 19 images · IV contrast (75CC OMNI 300)
Comparison: Chest x-ray of 05/17/2012

CLINICAL DATA: Right upper lobe lung nodule, cough, former smoking
history

CT CHEST WITH CONTRAST
TECHNIQUE: Multidetector CT imaging of the chest was performed
following the standard protocol during bolus administration of
intravenous contrast.
Contrast: 75mL OMNIPAQUE IOHEXOL 300 MG/ML  SOLN

[Series 102: chest with · axial · 0.78mm/px · z∈[-254,-94]mm · 4 of 66 slices shown, 5 images]
[im 17/66  mediastinal]
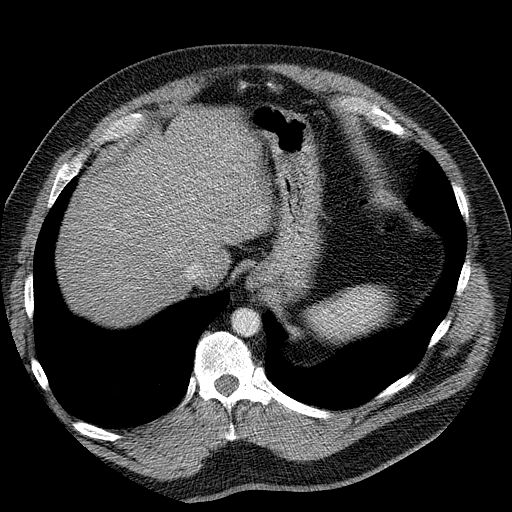
[im 17/66  lung]
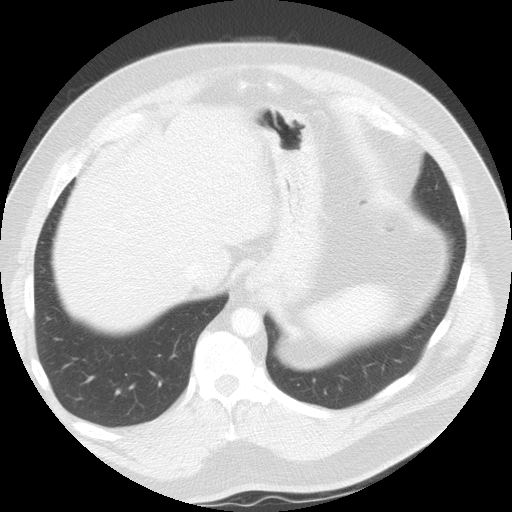
[im 33/66  lung]
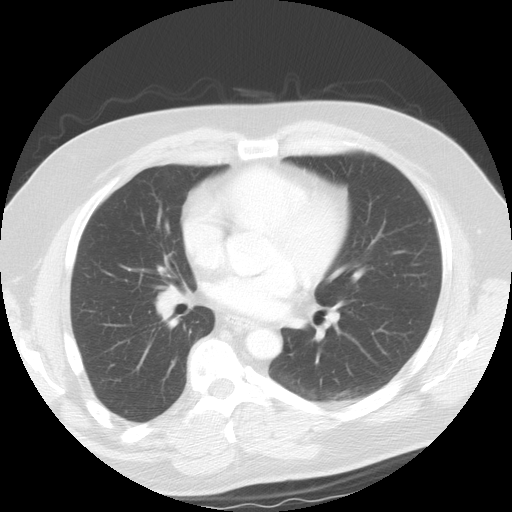
[im 36/66  lung]
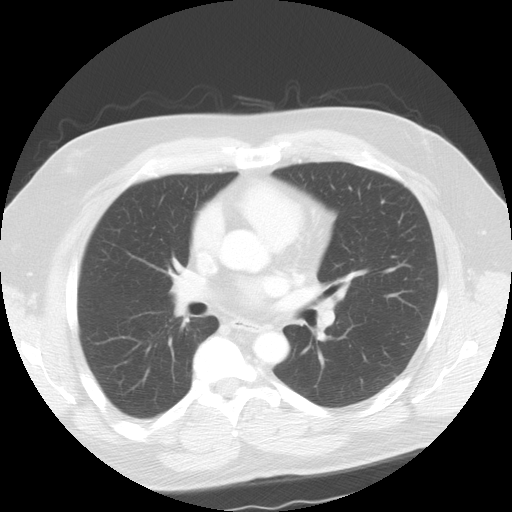
[im 49/66  lung]
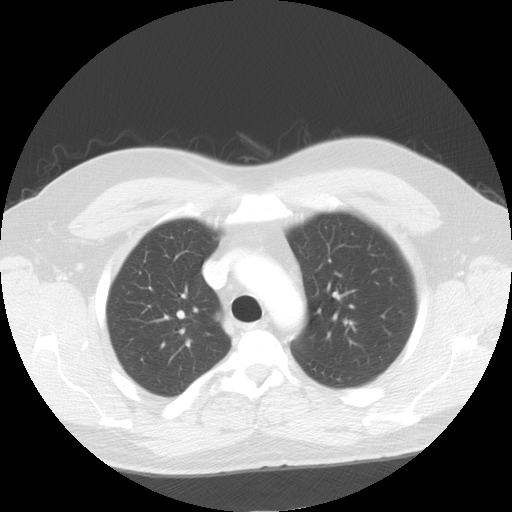

[Series 103: sag · sagittal · 0.78mm/px · 8 of 157 slices shown]
[im 14/157  lung]
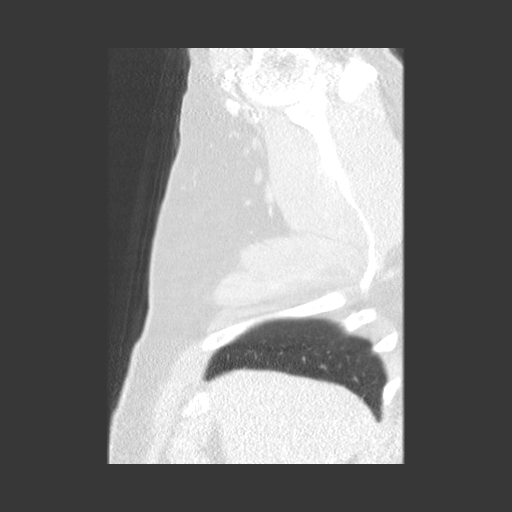
[im 40/157  lung]
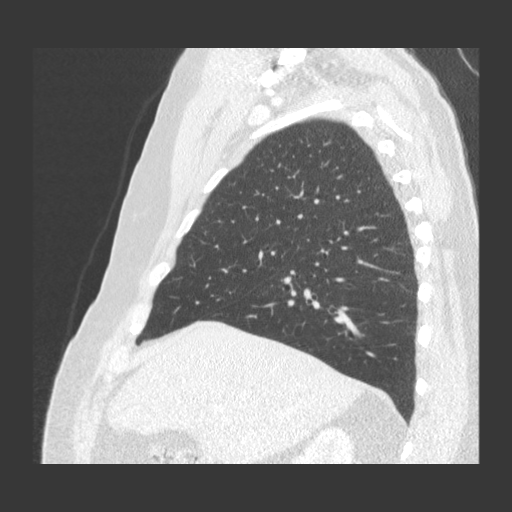
[im 53/157  lung]
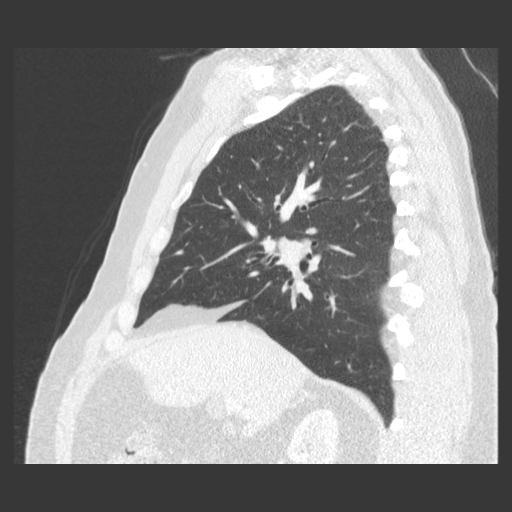
[im 66/157  lung]
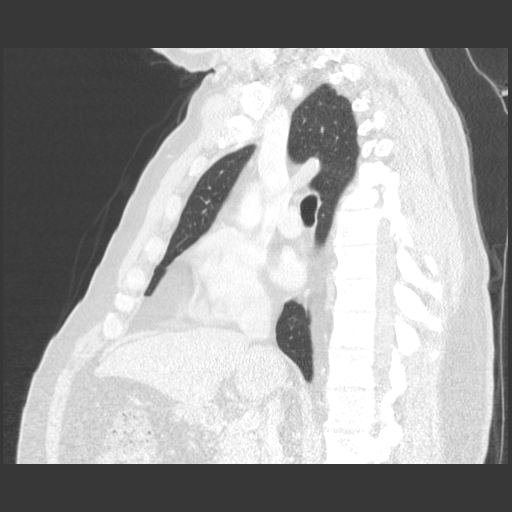
[im 92/157  lung]
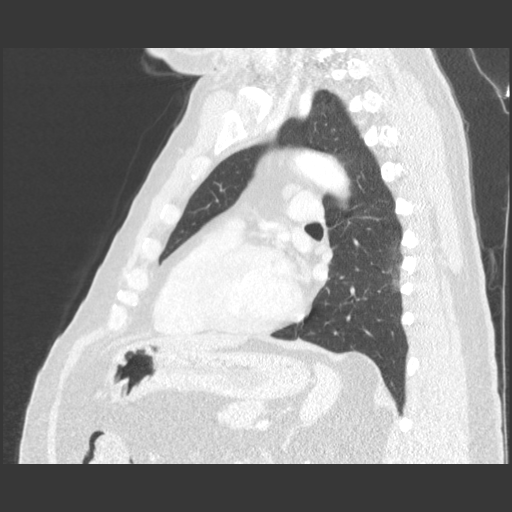
[im 105/157  lung]
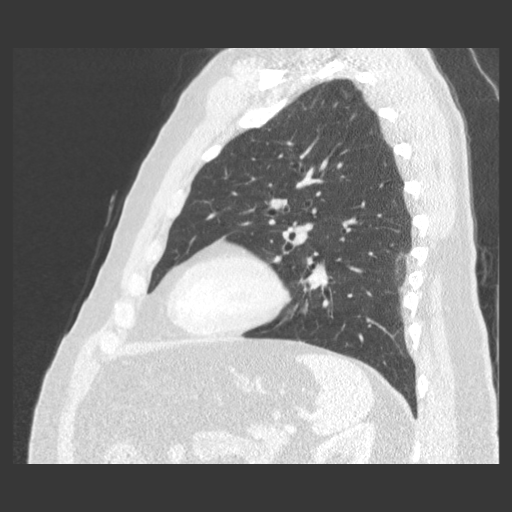
[im 118/157  lung]
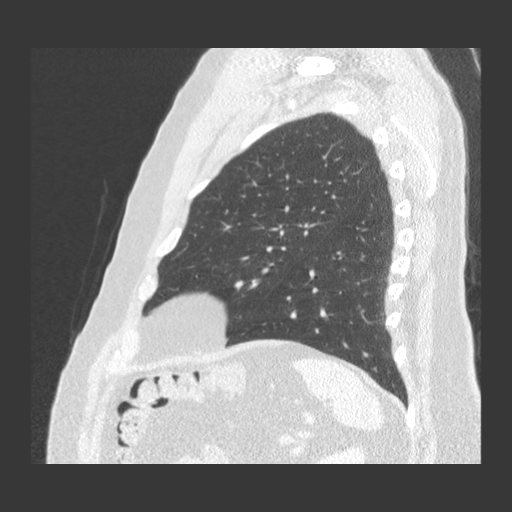
[im 144/157  lung]
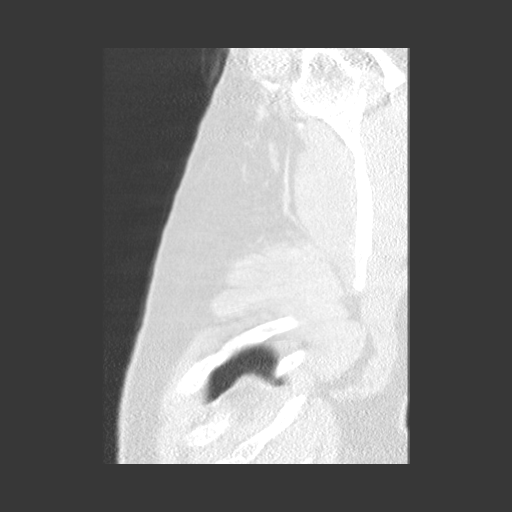

[Series 104: cor · coronal · 0.78mm/px · 6 of 124 slices shown]
[im 14/124  lung]
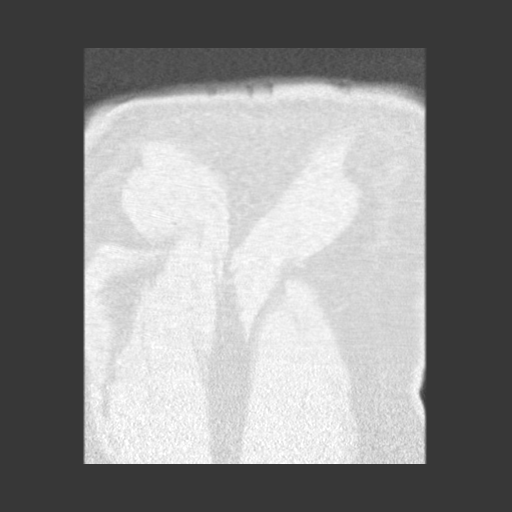
[im 28/124  lung]
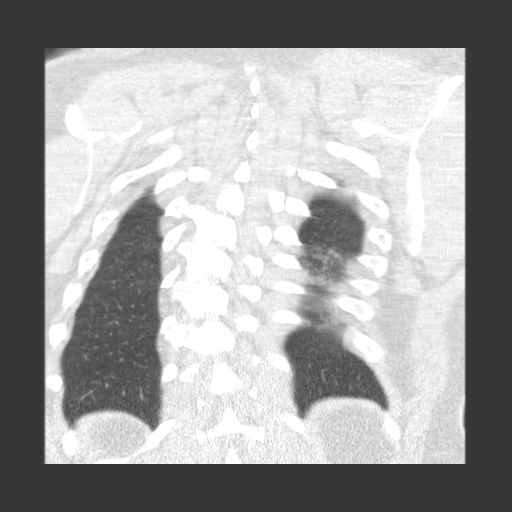
[im 42/124  lung]
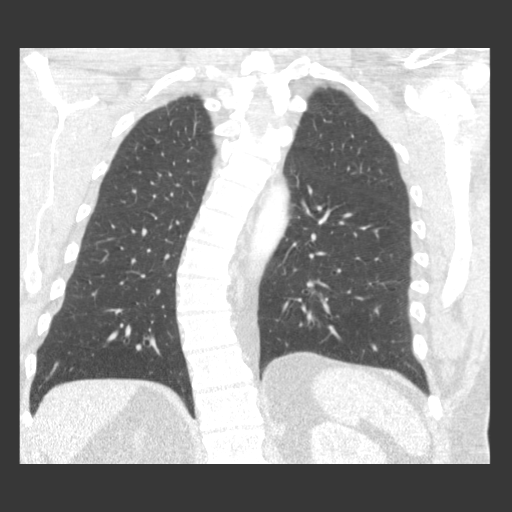
[im 55/124  lung]
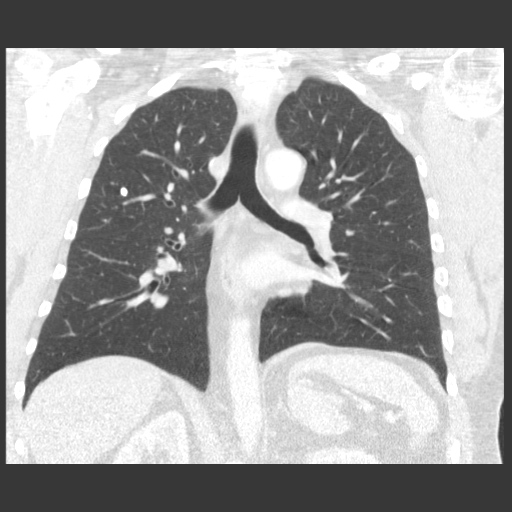
[im 69/124  lung]
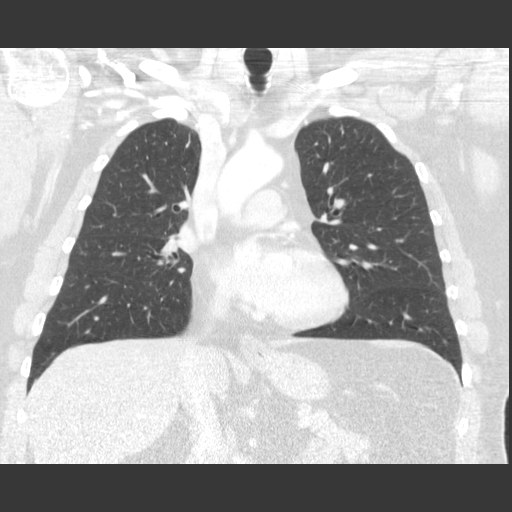
[im 83/124  lung]
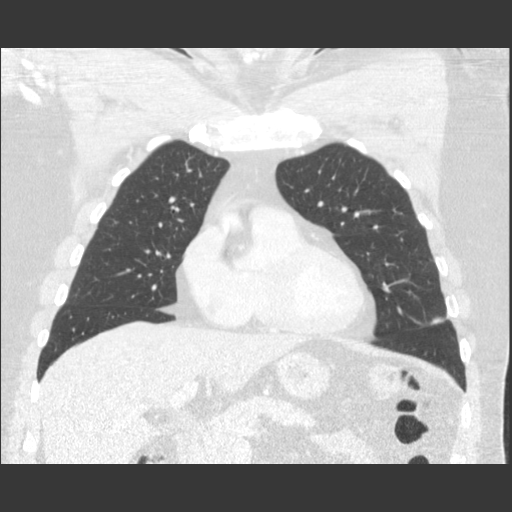

[18 of 29 positions shown; findings below may reference images not displayed]

FINDINGS: Corresponding to the nodule noted on chest x-ray, there
is a partially calcified granuloma within the right upper lobe.
This granuloma measures 6 mm in diameter.  A noncalcified nodule is
noted anteriorly which appears to abut the minor fissure and
probably represents a fissural lymph node of 8 mm in diameter.
However in view of the size of this nodule and follow-up CT chest
scan is recommended in 6 months.  No left lung nodule is seen.  No
pleural effusion is noted.  No focal infiltrate is seen. The airway
is patent.

On soft tissue window images, the thyroid gland is unremarkable.
The thoracic aorta opacifies with no significant abnormality and
the origins of the great vessels are patent.  The pulmonary
arteries are not as well opacified.  No mediastinal or hilar
adenopathy is seen.  Surgical clips are present in the right upper
quadrant from prior cholecystectomy. A thoracic scoliosis is noted.
IMPRESSION: 1.  The nodule questioned on chest x-ray in the right upper lobe
represents a partially calcified granuloma.
2.  Noncalcified 8 mm nodule anteriorly in the right middle lobe
abuts the fissure and appears thin on sagittal images, most
consistent with a lymph node.  However a follow-up CT chest scan is
recommended in 6 months to assess stability.
3.  Thoracic scoliosis.

## 2013-11-08 NOTE — Progress Notes (Signed)
Can we see if patient ever had labs done (CBC, iron, ferritin) in Nov 2014 by Dr. Dimas AguasHoward? We just need this for our records. Thanks!

## 2013-11-13 NOTE — Progress Notes (Signed)
Requested Labs

## 2013-11-13 NOTE — Progress Notes (Signed)
Records are in your cart.  

## 2018-01-03 ENCOUNTER — Encounter: Payer: Self-pay | Admitting: Internal Medicine
